# Patient Record
Sex: Male | Born: 1955 | Race: White | Hispanic: No | Marital: Married | Smoking: Never smoker
Health system: Southern US, Community
[De-identification: ages and names within clinical notes are randomized; demographics above are authoritative.]

## PROBLEM LIST (undated history)

## (undated) HISTORY — PX: BACK SURGERY: SHX140

## (undated) HISTORY — PX: KNEE SURGERY: SHX244

## (undated) HISTORY — PX: ROTATOR CUFF REPAIR: SHX139

---

## 2020-05-25 ENCOUNTER — Encounter (HOSPITAL_BASED_OUTPATIENT_CLINIC_OR_DEPARTMENT_OTHER): Payer: Self-pay

## 2020-05-25 ENCOUNTER — Emergency Department (HOSPITAL_BASED_OUTPATIENT_CLINIC_OR_DEPARTMENT_OTHER): Payer: Worker's Compensation

## 2020-05-25 ENCOUNTER — Emergency Department (HOSPITAL_BASED_OUTPATIENT_CLINIC_OR_DEPARTMENT_OTHER)
Admission: EM | Admit: 2020-05-25 | Discharge: 2020-05-25 | Disposition: A | Discharge status: Home / Self Care | Payer: Worker's Compensation | Attending: Emergency Medicine | Admitting: Emergency Medicine

## 2020-05-25 ENCOUNTER — Other Ambulatory Visit: Payer: Self-pay

## 2020-05-25 DIAGNOSIS — R52 Pain, unspecified: Secondary | ICD-10-CM

## 2020-05-25 DIAGNOSIS — M542 Cervicalgia: Secondary | ICD-10-CM | POA: Diagnosis not present

## 2020-05-25 DIAGNOSIS — R159 Full incontinence of feces: Secondary | ICD-10-CM | POA: Insufficient documentation

## 2020-05-25 DIAGNOSIS — M546 Pain in thoracic spine: Secondary | ICD-10-CM | POA: Diagnosis not present

## 2020-05-25 DIAGNOSIS — R202 Paresthesia of skin: Secondary | ICD-10-CM | POA: Insufficient documentation

## 2020-05-25 DIAGNOSIS — W19XXXA Unspecified fall, initial encounter: Secondary | ICD-10-CM

## 2020-05-25 DIAGNOSIS — W009XXA Unspecified fall due to ice and snow, initial encounter: Secondary | ICD-10-CM | POA: Insufficient documentation

## 2020-05-25 DIAGNOSIS — N200 Calculus of kidney: Secondary | ICD-10-CM | POA: Diagnosis not present

## 2020-05-25 DIAGNOSIS — R1012 Left upper quadrant pain: Secondary | ICD-10-CM | POA: Insufficient documentation

## 2020-05-25 DIAGNOSIS — R197 Diarrhea, unspecified: Secondary | ICD-10-CM | POA: Diagnosis not present

## 2020-05-25 DIAGNOSIS — R109 Unspecified abdominal pain: Secondary | ICD-10-CM | POA: Diagnosis present

## 2020-05-25 LAB — BASIC METABOLIC PANEL
Anion gap: 12 (ref 5–15)
BUN: 18 mg/dL (ref 8–23)
CO2: 20 mmol/L — ABNORMAL LOW (ref 22–32)
Calcium: 9.5 mg/dL (ref 8.9–10.3)
Chloride: 102 mmol/L (ref 98–111)
Creatinine, Ser: 0.9 mg/dL (ref 0.61–1.24)
GFR, Estimated: >60 mL/min (ref >60)
Glucose, Bld: 109 mg/dL — ABNORMAL HIGH (ref 70–99)
Potassium: 3.8 mmol/L (ref 3.5–5.1)
Sodium: 134 mmol/L — ABNORMAL LOW (ref 135–145)

## 2020-05-25 LAB — CBC
HCT: 45.3 % (ref 39.0–52.0)
Hemoglobin: 14.9 g/dL (ref 13.0–17.0)
MCH: 29.9 pg (ref 26.0–34.0)
MCHC: 32.9 g/dL (ref 30.0–36.0)
MCV: 90.8 fL (ref 80.0–100.0)
Platelets: 244 10*3/uL (ref 150–400)
RBC: 4.99 MIL/uL (ref 4.22–5.81)
RDW: 13.6 % (ref 11.5–15.5)
WBC: 8.5 10*3/uL (ref 4.0–10.5)
nRBC: 0 % (ref 0.0–0.2)

## 2020-05-25 MED ORDER — IOHEXOL 300 MG/ML  SOLN
100.0000 mL | Freq: Once | INTRAMUSCULAR | Status: AC | PRN
  Administered 2020-05-25: 100 mL via INTRAVENOUS

## 2020-05-25 MED ORDER — OXYCODONE HCL 5 MG PO TABS: 5.0000 mg | ORAL_TABLET | Freq: Four times a day (QID) | ORAL | 0 refills | Status: AC | PRN

## 2020-05-25 MED ORDER — ONDANSETRON HCL 4 MG PO TABS: 4.0000 mg | ORAL_TABLET | Freq: Four times a day (QID) | ORAL | 0 refills | Status: AC

## 2020-05-25 MED ORDER — TAMSULOSIN HCL 0.4 MG PO CAPS: 0.4000 mg | ORAL_CAPSULE | Freq: Every day | ORAL | 0 refills | Status: AC

## 2020-05-25 NOTE — ED Notes (Signed)
Patient transported to X-ray 

## 2020-05-25 NOTE — ED Triage Notes (Signed)
Pt states he slipped on ice/fell ~530pm yesterday "fell back on my tailbone"-denies head injury-pt states ~12am he was incontinent of stool and developed left side abd pain-also c/o tingling to hand and fingers-NAD-slow gait-seen at PCP office and advised to come to ED

## 2020-05-25 NOTE — ED Provider Notes (Signed)
With [facility]GENCY DEPARTMENT Provider Note   CSN: [id number] Arrival date & time: 05/25/20  1217     History Chief Complaint  Patient presents with  . Fall    Jonathan Glover is a 65 y.o. male.  HPI Patient presents for pain after fall.  States that yesterday morning around 5:30 AM he slipped getting out of his truck and landed straight down onto his tailbone.  Some back pain at the time.  Does have chronic back pain.  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  States he did not even know that he had to go.  Since then has not had a bowel movement.  Has some mild left-sided abdominal pain also.  States the pain in his mid to lower back.  States no weakness in his legs.  States that does have some mild tingling in his hands.  No weakness.  No loss conscious.  Mild neck pain    History reviewed. No pertinent past medical history.  There are no problems to display for this patient.   Past Surgical History:  Procedure Laterality Date  . BACK SURGERY    . KNEE SURGERY    . ROTATOR CUFF REPAIR         No family history on file.  Social History   Tobacco Use  . Smoking status: Never Smoker  . Smokeless tobacco: Never Used  Vaping Use  . Vaping Use: Never used  Substance Use Topics  . Alcohol use: Never  . Drug use: Never    Home Medications Prior to Admission medications   Not on File    Allergies    Patient has no known allergies.  Review of Systems   Review of Systems  Constitutional: Negative for appetite change.  HENT: Negative for congestion.   Respiratory: Negative for shortness of breath.   Cardiovascular: Negative for chest pain.  Gastrointestinal: Positive for abdominal pain and diarrhea.  Genitourinary: Negative for flank pain.  Musculoskeletal: Positive for back pain.  Skin: Negative for rash.  Neurological: Negative for weakness and  numbness.    Physical Exam Updated Vital Signs BP 133/63 (BP Location: Left Arm)   Pulse 81   Temp 98.3 F (36.8 C) (Oral)   Resp 18   Ht 5\' 8"  (1.727 m)   Wt 120.2 kg   SpO2 96%   BMI 40.29 kg/m   Physical Exam Vitals and nursing note reviewed.  HENT:     Head: Atraumatic.     Right Ear: External ear normal.     Left Ear: External ear normal.  Eyes:     Extraocular Movements: Extraocular movements intact.     Pupils: Pupils are equal, round, and reactive to light.  Neck:     Comments: No midline tenderness. Cardiovascular:     Rate and Rhythm: Normal rate and regular rhythm.  Pulmonary:     Breath sounds: No wheezing or rhonchi.  Abdominal:     Tenderness: There is abdominal tenderness.     Comments: Left upper quadrant tenderness without rebound or guarding.  No hernia palpated.  Genitourinary:    Comments: Rectal tone intact. Musculoskeletal:        General: Tenderness present.     Cervical back: Neck supple.     Comments: Some tenderness over the lower thoracic spine.  No step-off or deformity.  No extremity tenderness.  Skin:  General: Skin is warm.     Capillary Refill: Capillary refill takes less than 2 seconds.  Neurological:     Mental Status: He is alert and oriented to person, place, and time.     Comments: Strength and sensation intact in bilateral upper and lower extremities.  Psychiatric:        Mood and Affect: Mood normal.     ED Results / Procedures / Treatments   Labs (all labs ordered are listed, but only abnormal results are displayed) Labs Reviewed  BASIC METABOLIC PANEL - Abnormal; Notable for the following components:      Result Value   Sodium 134 (*)    CO2 20 (*)    Glucose, Bld 109 (*)    All other components within normal limits  CBC    EKG None  Radiology DG Thoracic Spine W/Swimmers  Result Date: 05/25/2020 CLINICAL DATA:  Slipped on ice at approximately 1730 hours yesterday, fell on tailbone, pain EXAM: THORACIC  SPINE - 3 VIEWS COMPARISON:  None FINDINGS: Twelve pairs of ribs. Vertebral body and disc space heights maintained. No fracture, subluxation, or bone destruction. Mild osseous demineralization. Atherosclerotic calcification aortic arch. IMPRESSION: No acute osseous abnormalities. Electronically Signed   By: Ulyses Southward M.D.   On: 05/25/2020 14:02    Procedures Procedures (including critical care time)  Medications Ordered in ED Medications  iohexol (OMNIPAQUE) 300 MG/ML solution 100 mL (100 mLs Intravenous Contrast Given 05/25/20 1519)    ED Course  I have reviewed the triage vital signs and the nursing notes.  Pertinent labs & imaging results that were available during my care of the patient were reviewed by me and considered in my medical decision making (see chart for details).    MDM Rules/Calculators/A&P                         patient presents with pain after a fall.  Landed yesterday straight onto his rear end.  However did have an episode of fecal incontinence after it.  Since then no neurodeficits except for some mild tingling in upper extremities.  Minimal neck pain.  Thoracic x-ray reassuring.  Will get imaging of cervical and lumbar spine but also abdomen due to left upper quadrant tenderness.  Care will be turned over Dr. Lockie Mola Months  Final Clinical Impression(s) / ED Diagnoses Final diagnoses:  Fall    Rx / DC Orders ED Discharge Orders    None       Benjiman Core, MD 05/25/20 1529

## 2020-05-25 NOTE — ED Provider Notes (Signed)
Patient signed out to me with low back pain after fall yesterday.  Also history of kidney stones.  Awaiting CT scans as having some pain in the left lower back.  States that he has some diarrhea but overall exam was not consistent with cauda equina per Dr. Rubin Payor.  Patient overall appears well.  CT scan showed 3 mm left-sided kidney stone which is likely the cause of his pain.  Written for pain medications and understands return precautions.  Has history of kidney stones.  Well-known to the management of kidney stones.  Discharged in the ED in good condition.  Understands return precautions.  Otherwise images were unremarkable.  This chart was dictated using voice recognition software.  Despite best efforts to proofread,  errors can occur which can change the documentation meaning.     Virgina Norfolk, DO 05/25/20 [phone]

## 2020-05-25 NOTE — Discharge Instructions (Signed)
Recommend 1000 mg of Tylenol 4 times a day as needed for pain, recommend 600 mg of Motrin up to 3 times a day as needed for pain, recommend oxycodone for breakthrough pain

## 2021-08-30 IMAGING — CT CT ABD-PELV W/ CM
2 of 5 series · 16 of 46 positions shown, 18 images · IV contrast (Omnipaque)
Comparison: None.

CLINICAL DATA: Status post fall.

EXAM:
CT ABDOMEN AND PELVIS WITH CONTRAST
TECHNIQUE: Multidetector CT imaging of the abdomen and pelvis was performed
using the standard protocol following bolus administration of
intravenous contrast.
CONTRAST:  100mL OMNIPAQUE IOHEXOL 300 MG/ML  SOLN

[Series 2: axial st · axial · 0.95mm/px · z∈[-553,-53]mm · 13 of 114 slices shown, 15 images]
[im 7/114  soft-tissue]
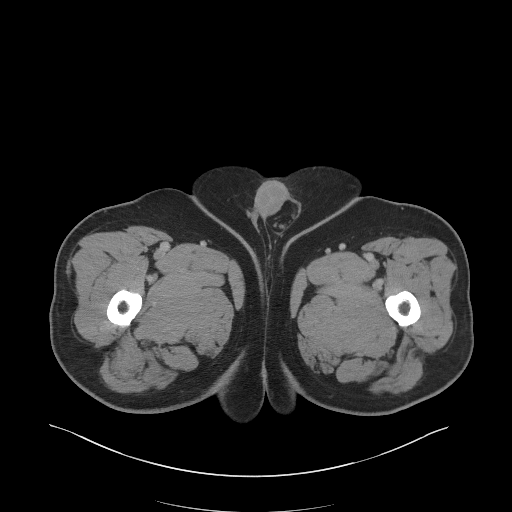
[im 7/114  bone]
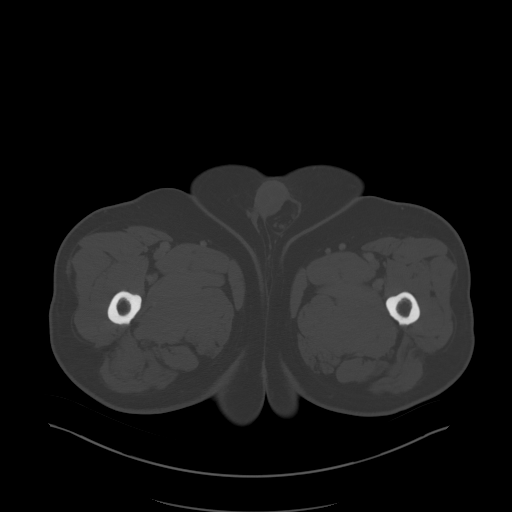
[im 13/114  soft-tissue]
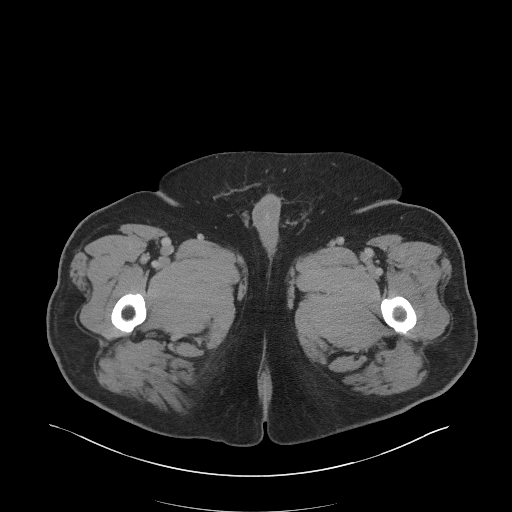
[im 26/114  soft-tissue]
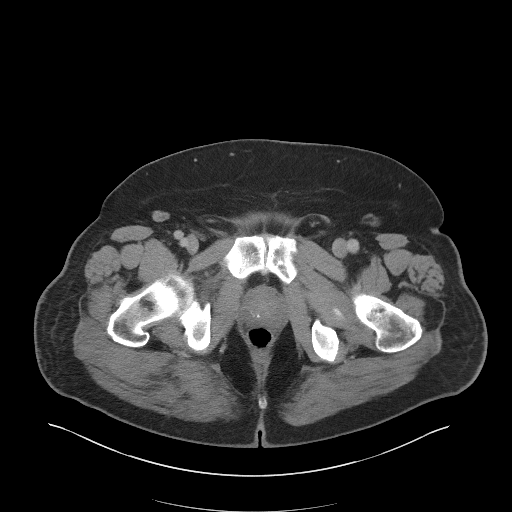
[im 32/114  soft-tissue]
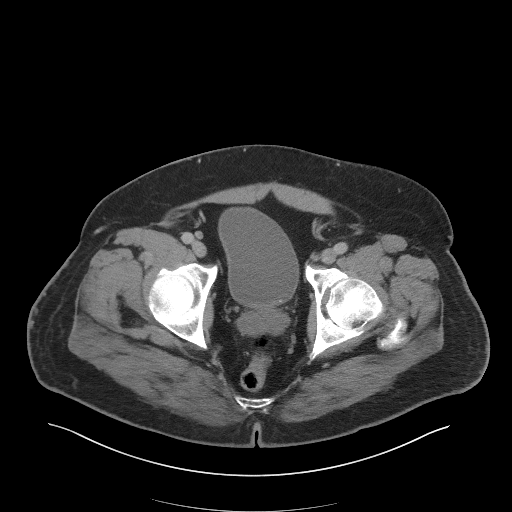
[im 38/114  soft-tissue]
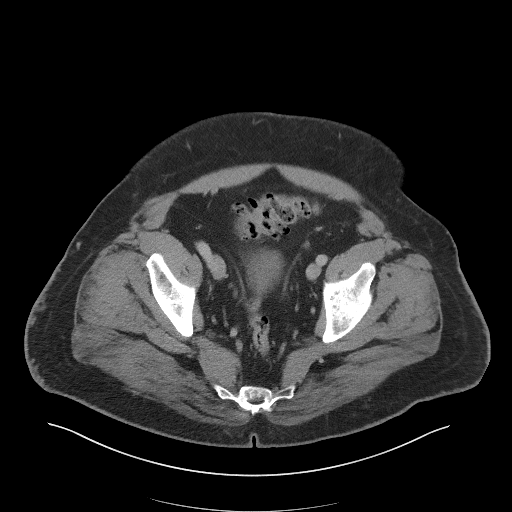
[im 51/114  soft-tissue]
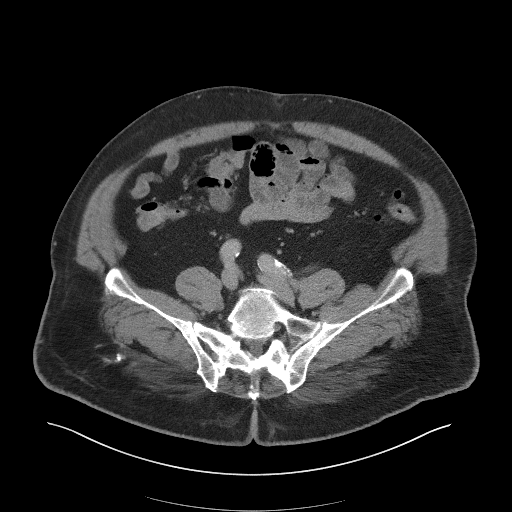
[im 57/114  soft-tissue]
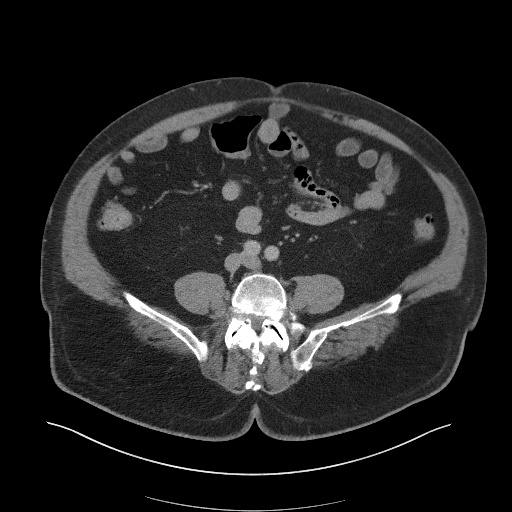
[im 63/114  soft-tissue]
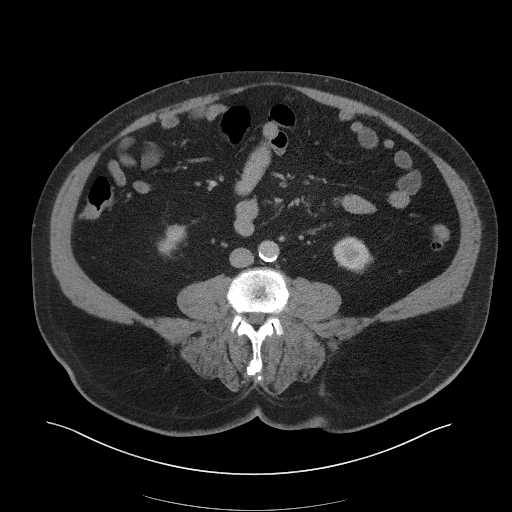
[im 76/114  soft-tissue]
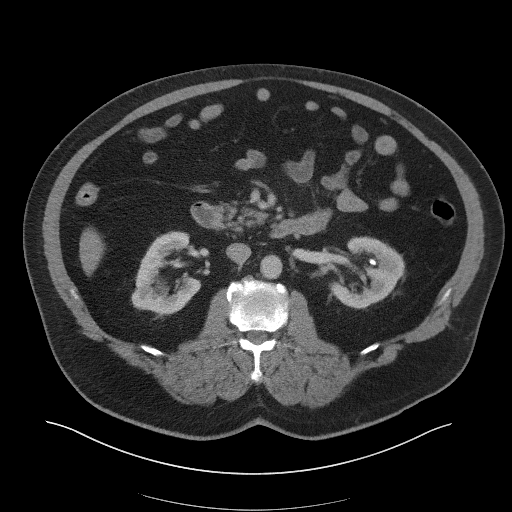
[im 76/114  bone]
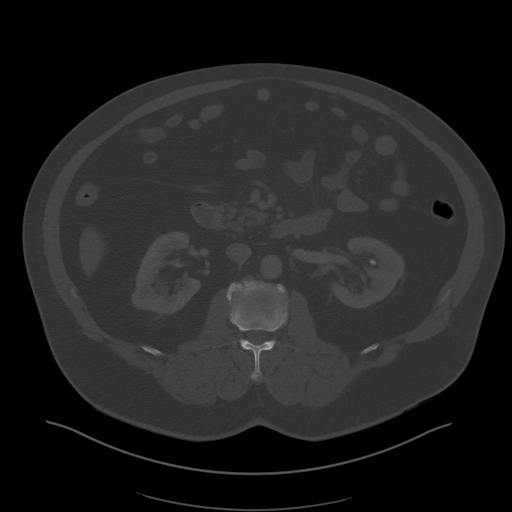
[im 82/114  soft-tissue]
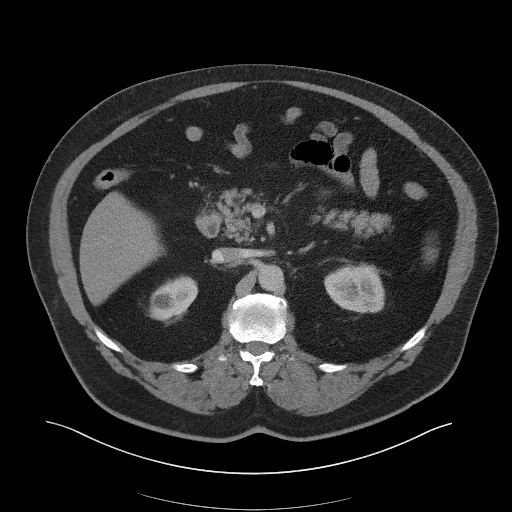
[im 88/114  soft-tissue]
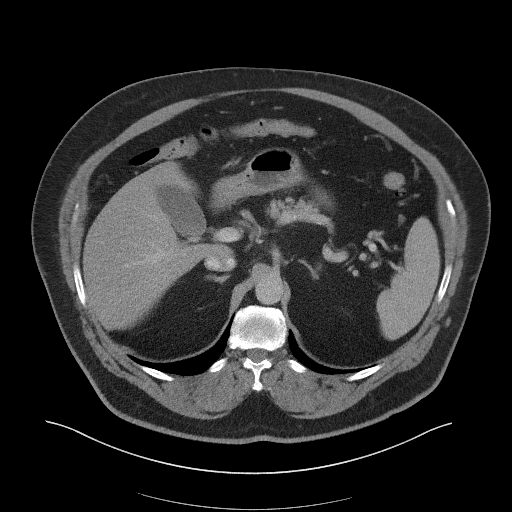
[im 101/114  soft-tissue]
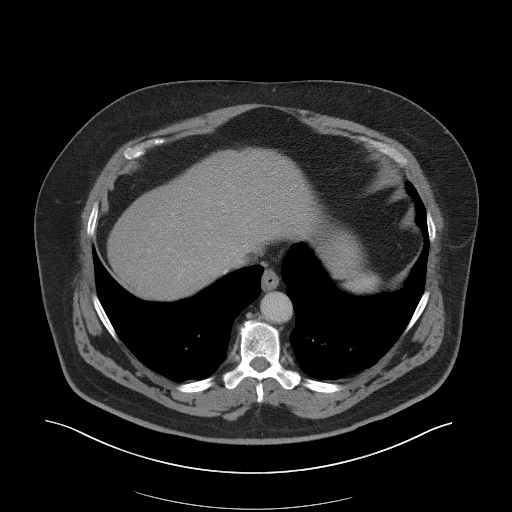
[im 107/114  soft-tissue]
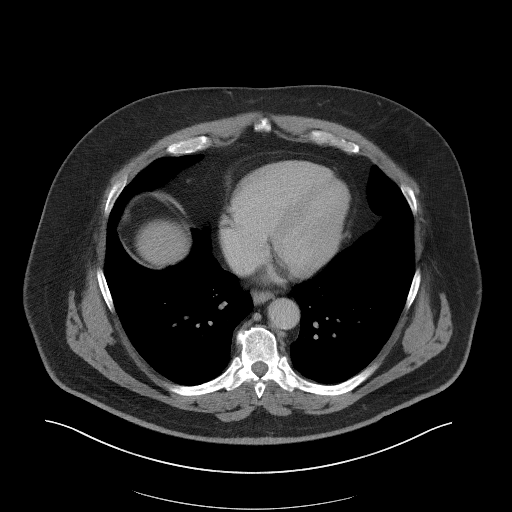

[Series 5: coronal st · coronal · 1.03mm/px · 3 of 124 slices shown]
[im 42/124  soft-tissue]
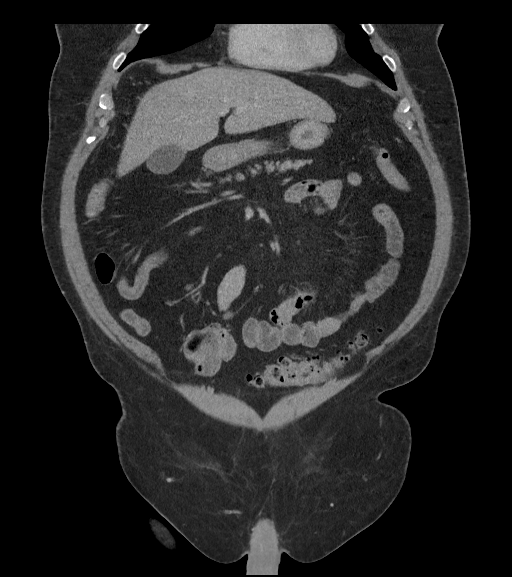
[im 55/124  soft-tissue]
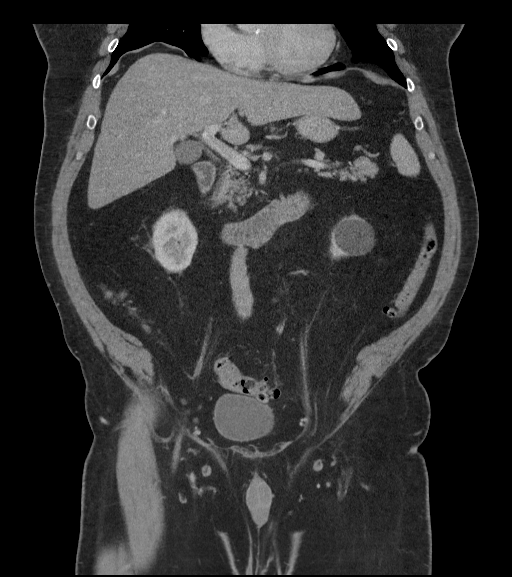
[im 69/124  soft-tissue]
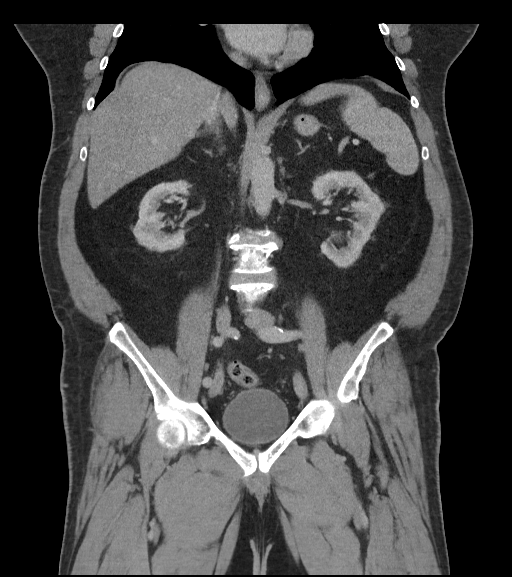

[16 of 46 positions shown; findings below may reference images not displayed]

FINDINGS: Lower chest: No acute abnormality.

Hepatobiliary: There is diffuse fatty infiltration of the liver
parenchyma. No focal liver abnormality is seen. No gallstones,
gallbladder wall thickening, or biliary dilatation.

Pancreas: Unremarkable. No pancreatic ductal dilatation or
surrounding inflammatory changes.

Spleen: Normal in size without focal abnormality.

Adrenals/Urinary Tract: Adrenal glands are unremarkable. Kidneys are
normal in size. A 3 mm renal stone is seen at the left UVJ, without
associated hydronephrosis or hydroureter. A 6 mm nonobstructing
renal stone is seen within the mid left kidney. Multiple
subcentimeter cystic appearing areas are seen scattered throughout
the right kidney. A 6.1 cm x 3.8 cm lobulated cystic appearing
structure is seen within the anterior aspect of the mid left kidney.
Bladder is unremarkable.

Stomach/Bowel: Stomach is within normal limits. The appendix is
poorly visualized. No evidence of bowel wall thickening, distention,
or inflammatory changes. Noninflamed diverticula are seen within the
descending and sigmoid colon.

Vascular/Lymphatic: Aortic atherosclerosis. No enlarged abdominal or
pelvic lymph nodes.

Reproductive: The prostate gland is mildly enlarged.

Other: A 3.3 cm x 2.3 cm fat containing left inguinal hernia is
seen. No abdominopelvic ascites.

Musculoskeletal: Degenerative changes seen throughout the lumbar
spine with marked severity bilateral facet joint hypertrophy is seen
at the level of L3-L4 and L4-L5.
IMPRESSION: 1. 3 mm renal stone at the left UVJ, without associated
hydronephrosis or hydroureter.
2. 6 mm nonobstructing renal stone within the mid left kidney.
3. Bilateral renal cysts.
4. Colonic diverticulosis.
5. Fat containing left inguinal hernia.
6. Hepatic steatosis.
7. Aortic atherosclerosis.

Aortic Atherosclerosis ([id number]).
